# Patient Record
Sex: Female | Born: 2000 | Race: White | Hispanic: No | Marital: Single | Smoking: Current every day smoker
Health system: Southern US, Community
[De-identification: ages and names within clinical notes are randomized; demographics above are authoritative.]

## PROBLEM LIST (undated history)

## (undated) DIAGNOSIS — J45909 Unspecified asthma, uncomplicated: Secondary | ICD-10-CM

## (undated) DIAGNOSIS — H6690 Otitis media, unspecified, unspecified ear: Secondary | ICD-10-CM

## (undated) HISTORY — PX: OTHER SURGICAL HISTORY: SHX169

---
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
## 2021-10-27 ENCOUNTER — Telehealth: Payer: Medicaid Other | Admitting: Physician Assistant

## 2021-10-27 DIAGNOSIS — L0501 Pilonidal cyst with abscess: Secondary | ICD-10-CM | POA: Diagnosis not present

## 2021-10-27 MED ORDER — DOXYCYCLINE HYCLATE 100 MG PO TABS: 100.0000 mg | ORAL_TABLET | Freq: Two times a day (BID) | ORAL | 0 refills | Status: AC

## 2021-10-27 MED ORDER — MUPIROCIN 2 % EX OINT: 1.0000 | TOPICAL_OINTMENT | Freq: Two times a day (BID) | CUTANEOUS | 0 refills | Status: AC

## 2021-10-27 NOTE — Patient Instructions (Signed)
Abigail Mann, thank you for joining Mar Daring, PA-C for today's virtual visit.  While this provider is not your primary care provider (PCP), if your PCP is located in our provider database this encounter information will be shared with them immediately following your visit.  Consent: (Patient) Abigail Mann provided verbal consent for this virtual visit at the beginning of the encounter.  Current Medications:  Current Outpatient Medications:    doxycycline (VIBRA-TABS) 100 MG tablet, Take 1 tablet (100 mg total) by mouth 2 (two) times daily., Disp: 20 tablet, Rfl: 0   mupirocin ointment (BACTROBAN) 2 %, Apply 1 Application topically 2 (two) times daily., Disp: 22 g, Rfl: 0   acetaminophen (TYLENOL) 325 MG tablet, Take 2 tablets (650 mg total) by mouth every 4 (four) hours as needed (for pain scale < 4)., Disp: , Rfl:    benzocaine-Menthol (DERMOPLAST) 20-0.5 % AERO, Apply 1 application topically as needed for irritation (perineal discomfort)., Disp: , Rfl:    coconut oil OIL, Apply 1 application topically as needed., Disp: , Rfl: 0   diphenhydrAMINE (BENADRYL) 25 mg capsule, Take 1 capsule (25 mg total) by mouth every 6 (six) hours as needed for itching., Disp: 30 capsule, Rfl: 0   ibuprofen (ADVIL) 600 MG tablet, Take 1 tablet (600 mg total) by mouth every 6 (six) hours., Disp: 30 tablet, Rfl: 0   Prenatal Vit-Fe Fumarate-FA (PRENATAL MULTIVITAMIN) TABS tablet, Take 1 tablet by mouth daily at 12 noon., Disp: , Rfl:    senna-docusate (SENOKOT-S) 8.6-50 MG tablet, Take 2 tablets by mouth daily., Disp: 30 tablet, Rfl: 0   simethicone (MYLICON) 80 MG chewable tablet, Chew 1 tablet (80 mg total) by mouth as needed for flatulence., Disp: 30 tablet, Rfl: 0   witch hazel-glycerin (TUCKS) pad, Apply 1 application topically as needed for hemorrhoids (for pain)., Disp: 40 each, Rfl: 12   Medications ordered in this encounter:  Meds ordered this encounter  Medications   doxycycline  (VIBRA-TABS) 100 MG tablet    Sig: Take 1 tablet (100 mg total) by mouth 2 (two) times daily.    Dispense:  20 tablet    Refill:  0    Order Specific Question:   Supervising Provider    Answer:   Chase Picket [[id number]]   mupirocin ointment (BACTROBAN) 2 %    Sig: Apply 1 Application topically 2 (two) times daily.    Dispense:  22 g    Refill:  0    Order Specific Question:   Supervising Provider    Answer:   Chase Picket [id number]     *If you need refills on other medications prior to your next appointment, please contact your pharmacy*  Follow-Up: Call back or seek an in-person evaluation if the symptoms worsen or if the condition fails to improve as anticipated.  [place] [phone]  Other Instructions Pilonidal Cyst  A pilonidal cyst is a fluid-filled sac that forms under the skin near the tailbone, at the top of the crease of the buttocks (pilonidal area). Cysts that are small and not infected may not cause any problems. Cysts that become irritated or infected may grow and fill with pus. An infected cyst is called an abscess. A pilonidal abscess may cause pain and swelling. It may need to be drained or removed. What are the causes? The cause of this condition is not always known. In some cases, it may be caused by a hair that grows into your  skin (ingrown hair). What increases the risk? You are more likely to develop this condition if: You are female. You have lots of hair near the crease of the buttocks. You are overweight. You have a dimple near the crease of the buttocks. You wear tight clothing. You do not bathe or shower often. You sit for long periods of time. What are the signs or symptoms? Symptoms of this condition may include pain, swelling, redness, and warmth in the pilonidal area. You may also be able to feel a lump near your tailbone if your cyst is big. If your cyst becomes infected, symptoms may include: Pus or fluid  drainage. Fever. Pain, swelling, and redness getting worse. The lump getting bigger. How is this diagnosed? This condition may be diagnosed based on: Your symptoms and medical history. A physical exam. A blood test to check for infection. A test of a pus sample. How is this treated? You may not need any treatment if your cyst does not cause symptoms. If your cyst bothers you or is infected, you may need a procedure to drain or remove the cyst. Depending on the size, location, and severity of your cyst, your health care provider may: Make an incision in the cyst and drain it (incision anddrainage). Open and drain the cyst, and then stitch the wound so that it stays open while it heals (marsupialization). You will be given instructions about how to care for your open wound while it heals. Remove all or part of the cyst, and then close the wound (cyst removal). You may need to take antibiotics before your procedure. Follow these instructions at home: Medicines Take over-the-counter and prescription medicines only as told by your health care provider. If you were prescribed antibiotics, take them as told by your health care provider. Do not stop taking the antibiotic even if you start to feel better. General instructions Keep the area around the cyst clean and dry. If there is fluid or pus draining from your cyst: Cover the area with a clean bandage (dressing). Wash the area gently with soap and water. Pat the area dry with a clean towel. Do not rub the area because that may cause bleeding. Remove hair from the area around the cyst only if your health care provider tells you to. Do not wear tight pants or sit in one position for long periods at a time. Contact a health care provider if: You have new redness, swelling, or pain. You have a fever. You have severe pain. Summary A pilonidal cyst is a fluid-filled sac that forms under the skin near the tailbone, at the top of the crease of the  buttocks (pilonidal area). Cysts that become irritated or infected may grow and fill with pus. An infected cyst is called an abscess. The cause of this condition is not always known. In some cases, it may be caused by a hair that grows into your skin (ingrown hair). You may not need any treatment if your cyst does not cause symptoms. If your cyst bothers you or is infected, you may need a procedure to drain or remove the cyst. This information is not intended to replace advice given to you by your health care provider. Make sure you discuss any questions you have with your health care provider. Document Revised: 03/29/2021 Document Reviewed: 03/29/2021 Elsevier Patient Education  2023 [employer].    If you have been instructed to have an in-person evaluation today at a local Urgent Care facility, please use the  link below. It will take you to a list of all of our available [facility], including address, phone number and hours of operation. Please do not delay care.  [facility]  If you or a family member do not have a primary care provider, use the link below to schedule a visit and establish care. When you choose a [facility] primary care physician or advanced practice provider, you gain a long-term partner in health. Find a Primary Care Provider  Learn more about [facility]'s in-office and virtual care options: [facility]

## 2021-10-27 NOTE — Progress Notes (Signed)
Virtual Visit Consent   [name], you are scheduled for a virtual visit with a [facility] provider today. Just as with appointments in the office, your consent must be obtained to participate. Your consent will be active for this visit and any virtual visit you may have with one of our providers in the next 365 days. If you have a MyChart account, a copy of this consent can be sent to you electronically.  As this is a virtual visit, video technology does not allow for your provider to perform a traditional examination. This may limit your provider's ability to fully assess your condition. If your provider identifies any concerns that need to be evaluated in person or the need to arrange testing (such as labs, EKG, etc.), we will make arrangements to do so. Although advances in technology are sophisticated, we cannot ensure that it will always work on either your end or our end. If the connection with a video visit is poor, the visit may have to be switched to a telephone visit. With either a video or telephone visit, we are not always able to ensure that we have a secure connection.  By engaging in this virtual visit, you consent to the provision of healthcare and authorize for your insurance to be billed (if applicable) for the services provided during this visit. Depending on your insurance coverage, you may receive a charge related to this service.  I need to obtain your verbal consent now. Are you willing to proceed with your visit today? ALLEAN SCHWIEBERT has provided verbal consent on 10/27/2021 for a virtual visit (video or telephone). Mar Daring, PA-C  Date: 10/27/2021 7:59 AM  Virtual Visit via Video Note   I, Mar Daring, connected with  Abigail Mann  ([id number], 28-Jun-2000) on 10/27/21 at  8:00 AM EDT by a video-enabled telemedicine application and verified that I am speaking with the correct person using two identifiers.  Location: Patient: Virtual Visit  Location Patient: Home Provider: Virtual Visit Location Provider: Home Office   I discussed the limitations of evaluation and management by telemedicine and the availability of in person appointments. The patient expressed understanding and agreed to proceed.    History of Present Illness: Abigail Mann is a 21 y.o. who identifies as a female who was assigned female at birth, and is being seen today for boil. It is at the top of the buttocks. Started about 5 days ago. Has had similar in the past. Has been applying Mupirocin cream and antibiotic soaps. Now starting to cause significant pain with movements and having some mild nausea with it.    Problems:  Patient Active Problem List   Diagnosis Date Noted   Premature rupture of membranes 08/18/2020   Pregnancy 08/17/2020    Allergies:  Allergies  Allergen Reactions   Other Anaphylaxis    Sour candy (??) Had to receive a shot of Epinephrine   Amoxicillin Hives    Did it involve swelling of the face/tongue/throat, SOB, or low BP? No Did it involve sudden or severe rash/hives, skin peeling, or any reaction on the inside of your mouth or nose? Yes Did you need to seek medical attention at a hospital or doctor's office? Yes When did it last happen?     Pt was 21 yrs old  If all above answers are "NO", may proceed with cephalosporin use.     Mango Flavor    Penicillins Other (See Comments)    Did it  involve swelling of the face/tongue/throat, SOB, or low BP? Unknown Did it involve sudden or severe rash/hives, skin peeling, or any reaction on the inside of your mouth or nose? Unknown Did you need to seek medical attention at a hospital or doctor's office? Unknown When did it last happen?      3 yrs ago had a rx to amoxicillin If all above answers are "NO", may proceed with cephalosporin use.    Pineapple    Medications:  Current Outpatient Medications:    doxycycline (VIBRA-TABS) 100 MG tablet, Take 1 tablet (100 mg total) by mouth  2 (two) times daily., Disp: 20 tablet, Rfl: 0   mupirocin ointment (BACTROBAN) 2 %, Apply 1 Application topically 2 (two) times daily., Disp: 22 g, Rfl: 0   acetaminophen (TYLENOL) 325 MG tablet, Take 2 tablets (650 mg total) by mouth every 4 (four) hours as needed (for pain scale < 4)., Disp: , Rfl:    benzocaine-Menthol (DERMOPLAST) 20-0.5 % AERO, Apply 1 application topically as needed for irritation (perineal discomfort)., Disp: , Rfl:    coconut oil OIL, Apply 1 application topically as needed., Disp: , Rfl: 0   diphenhydrAMINE (BENADRYL) 25 mg capsule, Take 1 capsule (25 mg total) by mouth every 6 (six) hours as needed for itching., Disp: 30 capsule, Rfl: 0   ibuprofen (ADVIL) 600 MG tablet, Take 1 tablet (600 mg total) by mouth every 6 (six) hours., Disp: 30 tablet, Rfl: 0   Prenatal Vit-Fe Fumarate-FA (PRENATAL MULTIVITAMIN) TABS tablet, Take 1 tablet by mouth daily at 12 noon., Disp: , Rfl:    senna-docusate (SENOKOT-S) 8.6-50 MG tablet, Take 2 tablets by mouth daily., Disp: 30 tablet, Rfl: 0   simethicone (MYLICON) 80 MG chewable tablet, Chew 1 tablet (80 mg total) by mouth as needed for flatulence., Disp: 30 tablet, Rfl: 0   witch hazel-glycerin (TUCKS) pad, Apply 1 application topically as needed for hemorrhoids (for pain)., Disp: 40 each, Rfl: 12  Observations/Objective: Patient is well-developed, well-nourished in no acute distress.  Resting comfortably at home.  Head is normocephalic, atraumatic.  No labored breathing.  Speech is clear and coherent with logical content.  Patient is alert and oriented at baseline.    Assessment and Plan: 1. Pilonidal cyst with abscess - doxycycline (VIBRA-TABS) 100 MG tablet; Take 1 tablet (100 mg total) by mouth 2 (two) times daily.  Dispense: 20 tablet; Refill: 0 - mupirocin ointment (BACTROBAN) 2 %; Apply 1 Application topically 2 (two) times daily.  Dispense: 22 g; Refill: 0  - Doxycycline prescribed; confirmed patient not breastfeeding -  Mupirocin refilled - Warm compresses - Epsom salt soaks - Keep area clean and dry - Seek in person evaluation if not improving or worsens  Follow Up Instructions: I discussed the assessment and treatment plan with the patient. The patient was provided an opportunity to ask questions and all were answered. The patient agreed with the plan and demonstrated an understanding of the instructions.  A copy of instructions were sent to the patient via MyChart unless otherwise noted below.    The patient was advised to call back or seek an in-person evaluation if the symptoms worsen or if the condition fails to improve as anticipated.  Time:  I spent 8 minutes with the patient via telehealth technology discussing the above problems/concerns.    Mar Daring, PA-C

## 2021-12-05 IMAGING — US US OB COMP +14 WK
1 series · 13 of 28 positions shown · non-contrast
Comparison: none

CLINICAL DATA: Second trimester pregnancy, unknown gestational age

EXAM:
OBSTETRICAL ULTRASOUND >14 WKS

[Series 1: us ob comp + 14 wk · 13 of 86 slices shown]
[im 4/86]
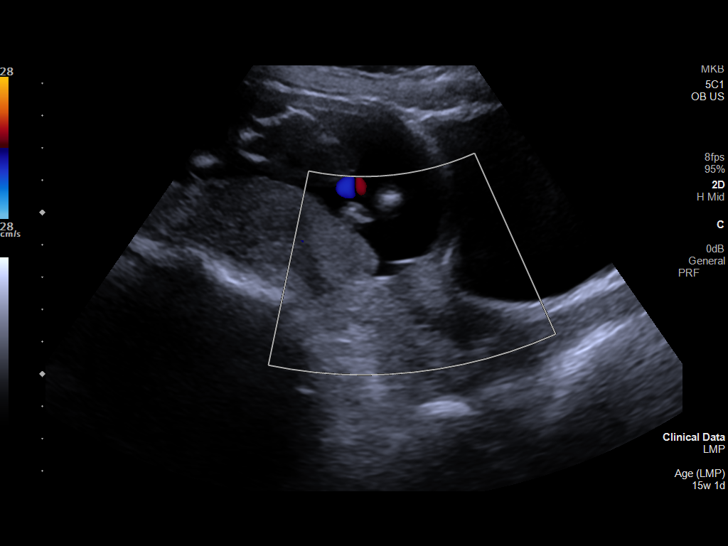
[im 10/86]
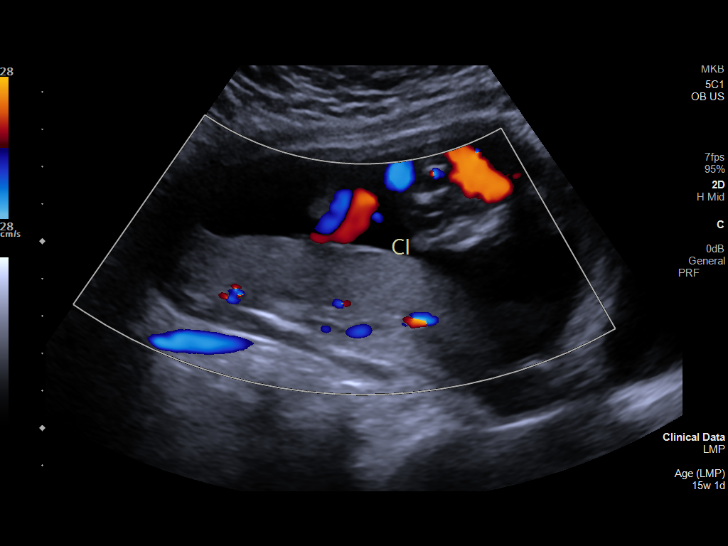
[im 16/86]
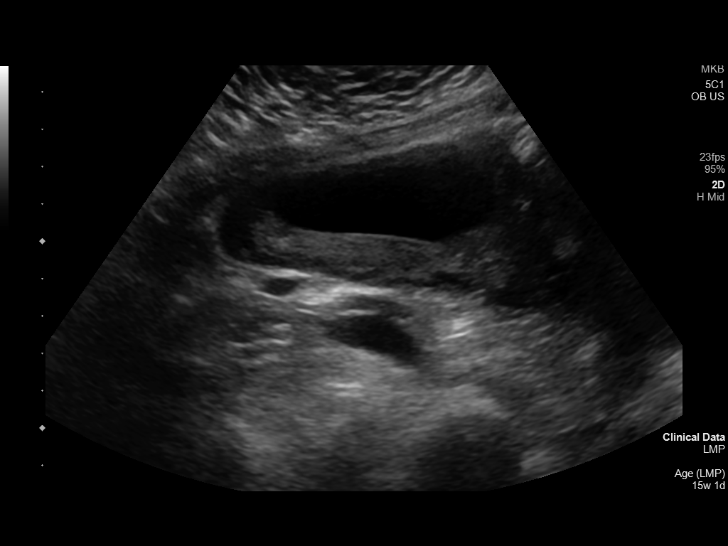
[im 23/86]
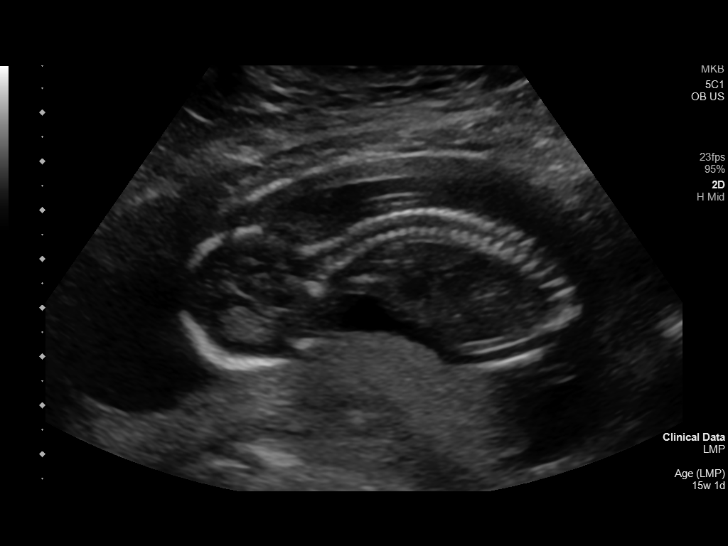
[im 29/86]
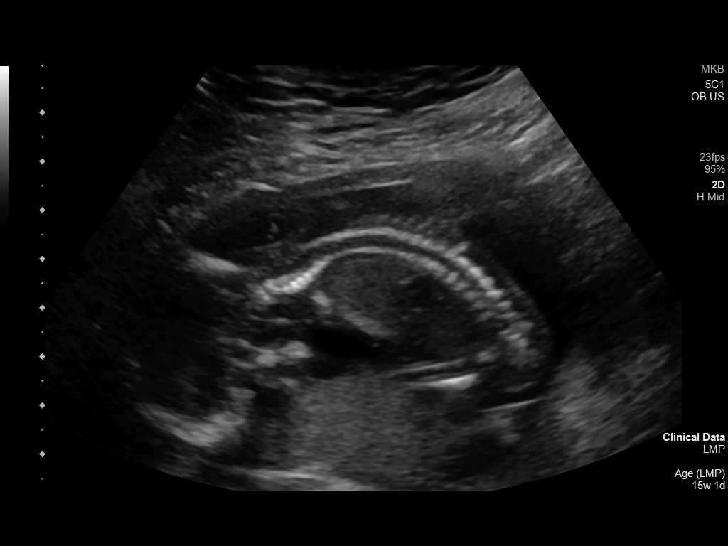
[im 35/86]
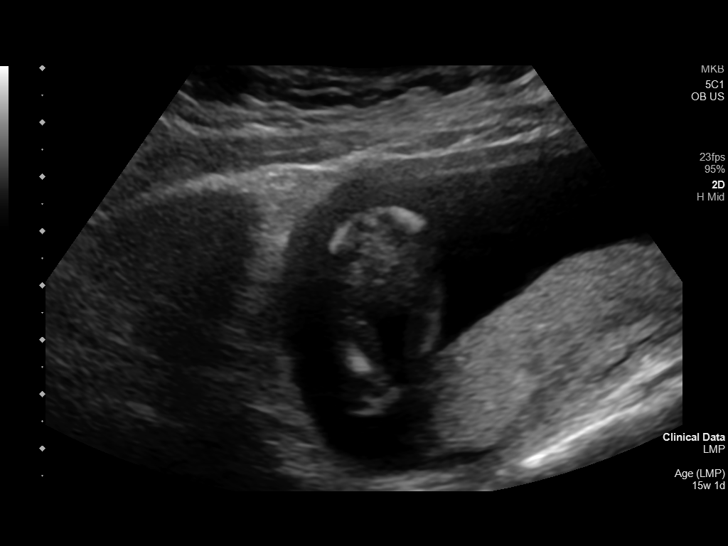
[im 45/86]
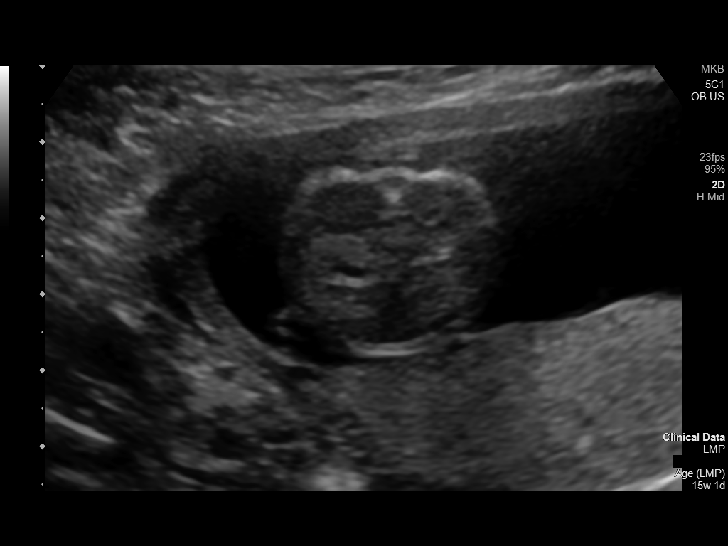
[im 51/86]
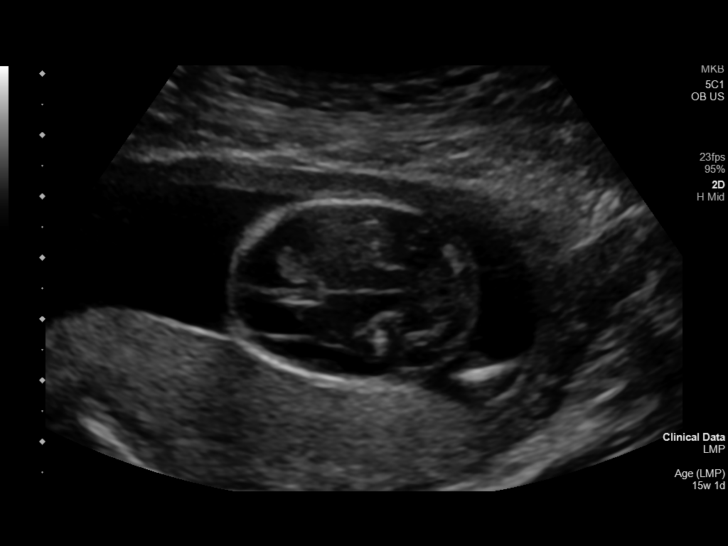
[im 57/86]
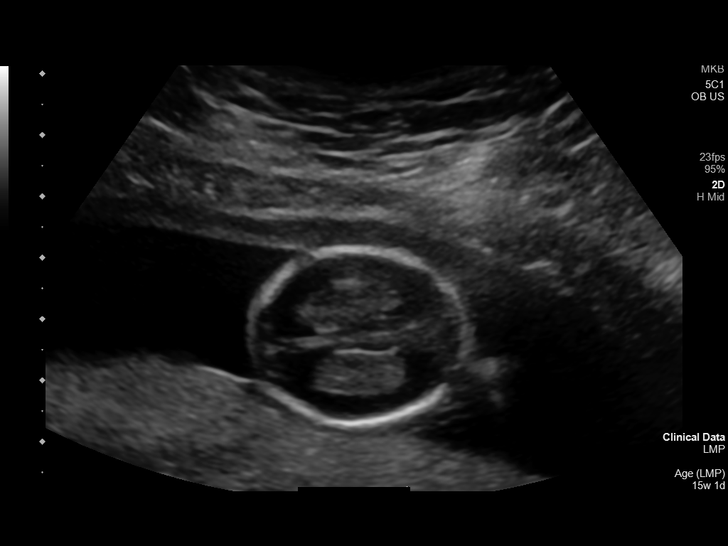
[im 63/86]
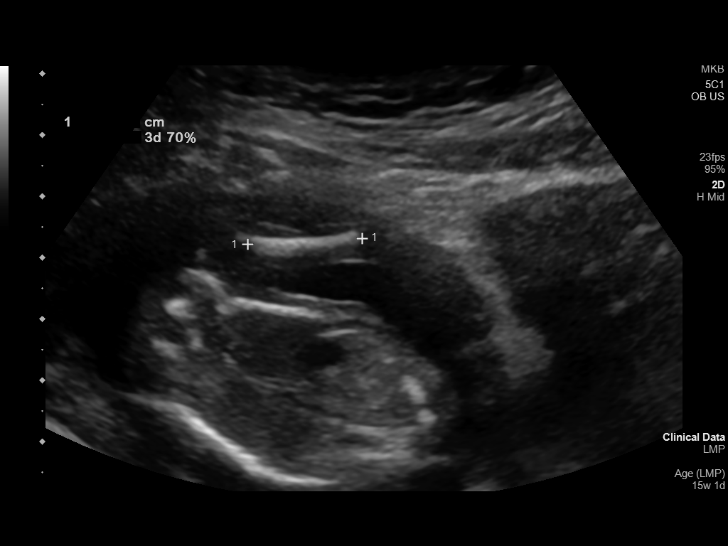
[im 70/86]
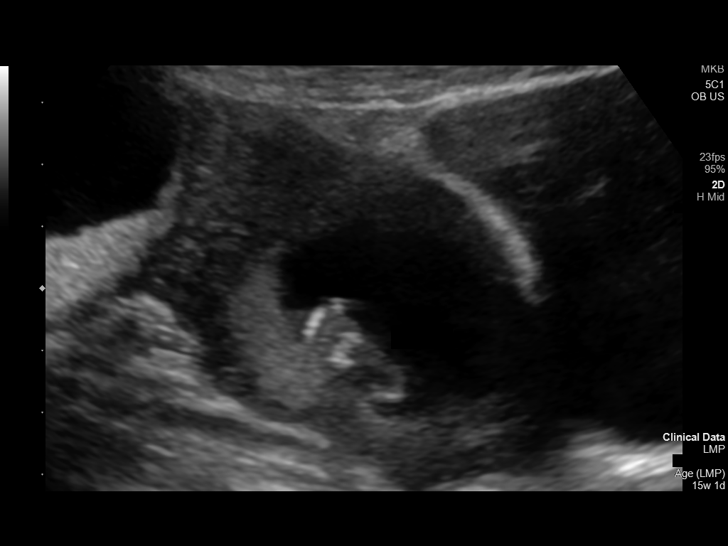
[im 76/86]
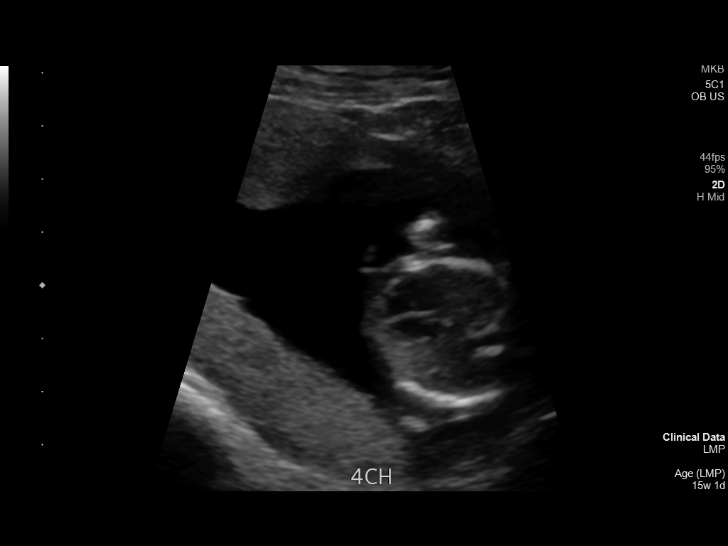
[im 82/86]
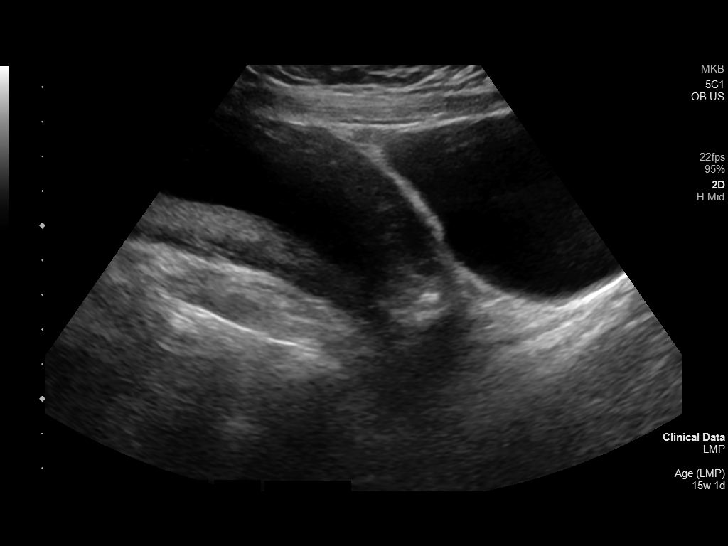

[13 of 28 positions shown; findings below may reference images not displayed]

FINDINGS: Number of Fetuses: 1

Heart Rate:  153 bpm

Movement: Yes

Presentation: Breech

Previa: No, the placenta is low lying with inferior margin 1.1 cm
from the internal cervical os.

Placental Location: Posterior

Amniotic Fluid (Subjective): Normal

Amniotic Fluid (Objective):

Vertical pocket = 2.7 cmcm

FETAL BIOMETRY

BPD: 3.0cm 50w 4d

HC:   11.7cm 50w 5d

AC:   9.7cm 15w 5d

FL:   1.9cm 15w 4d

Current Mean GA: 15w 4d US EDC: 08/18/2020

Assigned GA:  15w 1d Assigned EDC: 08/21/2020

FETAL ANATOMY

Lateral Ventricles: Appears normal

Thalami/CSP: Appears normal

Posterior Fossa:  Appears normal

Nuchal Region: Appears normal   NFT= 2.4 mm

Upper Lip: Not visualized

Spine: Appears normal

4 Chamber Heart on Left: Limited views appear normal

LVOT: Not visualized

RVOT: Not visualized

Stomach on Left: Appears normal

3 Vessel Cord: Not visualized

Cord Insertion site: Not visualized

Kidneys: Appears normal

Bladder: Appears normal

Extremities: Appears normal

Sex: Not Visualized

Technically difficult due to: Early gestational age

Maternal Findings:

Cervix:  Closed, 4.0 cm in length
IMPRESSION: 1. Single live intrauterine pregnancy as above, estimated
gestational age 15 weeks and 4 days.
2. Incomplete anatomic survey, with suboptimal visualization of the
upper lip, four-chamber heart, ventricular outflow tracks, umbilical
cord, cord insertion, and fetal sex. Follow-up obstetric ultrasound
at 20 weeks gestation is recommended to complete the anatomic
survey.
3. Low lying placenta without frank placenta previa. This could be
followed at the time of subsequent obstetric ultrasound.

## 2023-10-22 ENCOUNTER — Other Ambulatory Visit: Payer: Self-pay | Admitting: Family Medicine

## 2023-10-22 DIAGNOSIS — N6002 Solitary cyst of left breast: Secondary | ICD-10-CM

## 2023-10-22 DIAGNOSIS — N6325 Unspecified lump in the left breast, overlapping quadrants: Secondary | ICD-10-CM

## 2023-11-12 ENCOUNTER — Ambulatory Visit
Admission: RE | Admit: 2023-11-12 | Discharge: 2023-11-12 | Disposition: A | Discharge status: Home / Self Care | Source: Ambulatory Visit | Attending: Family Medicine | Admitting: Family Medicine

## 2023-11-12 DIAGNOSIS — N6325 Unspecified lump in the left breast, overlapping quadrants: Secondary | ICD-10-CM | POA: Insufficient documentation

## 2023-11-12 DIAGNOSIS — N6002 Solitary cyst of left breast: Secondary | ICD-10-CM | POA: Diagnosis present

## 2023-12-17 ENCOUNTER — Inpatient Hospital Stay: Attending: Licensed Clinical Social Worker | Admitting: Licensed Clinical Social Worker

## 2023-12-17 ENCOUNTER — Inpatient Hospital Stay

## 2023-12-17 NOTE — Progress Notes (Deleted)
 REFERRING PROVIDER: [facility], [facility] [address] [place],  [place] [address]  PRIMARY PROVIDER:  [facility], [facility]  PRIMARY REASON FOR VISIT:  1. Family history of breast cancer      HISTORY OF PRESENT ILLNESS:   Ms. Hoston, a 23 y.o. female, was seen for a [place] cancer genetics consultation at the request of Dr. Lorel due to a family history of breast cancer.  Ms. Scism presents to clinic today to discuss the possibility of a hereditary predisposition to cancer, genetic testing, and to further clarify her future cancer risks, as well as potential cancer risks for family members.   CANCER HISTORY:  Ms. Gawthrop is a 23 y.o. female with no personal history of cancer.    RELEVANT MEDICAL HISTORY:  Menarche was at age ***.  First live birth at age ***.  Ovaries intact: {Yes/No-Ex:120004}.  Hysterectomy: {Yes/No-Ex:120004}.  Menopausal status: {Menopause:31378}.  HRT use: {Numbers 1-12 multi-select:20307} years. Colonoscopy: {Yes/No-Ex:120004}; {normal/abnormal/not examined:14677}. Mammogram within the last year: {Yes/No-Ex:120004}. Number of breast biopsies: {Numbers 1-12 multi-select:20307}.  Past Medical History:  Diagnosis Date   Asthma    Otitis     Past Surgical History:  Procedure Laterality Date   tubes in ears      FAMILY HISTORY:  We obtained a detailed, 4-generation family history.  Significant diagnoses are listed below: Family History  Problem Relation Age of Onset   Hypertension Maternal Grandmother     Ms. Gernert is unaware of previous family history of genetic testing for hereditary cancer risks. There is no reported Ashkenazi Jewish ancestry. There is no known consanguinity.  GENETIC COUNSELING ASSESSMENT: Ms. Roylance is a 23 y.o. female with a family history of breast cancer*** which is somewhat suggestive of a hereditary cancer syndrome and predisposition to cancer. We, therefore, discussed  and recommended the following at today's visit.   DISCUSSION: We discussed that, in general, most cancer is not inherited in families, but instead is sporadic or familial. Sporadic cancers occur by chance and typically happen at older ages (>50 years). We discussed that approximately 10% of breast cancer is hereditary. Most cases of hereditary breast cancer are associated with BRCA1/BRCA2 genes, although there are other genes associated with hereditary cancer as well. Cancers and risks are gene specific. We discussed that testing is beneficial for several reasons including knowing about cancer risks, identifying potential screening and risk-reduction options that may be appropriate, and to understand if other family members could be at risk for cancer and allow them to undergo genetic testing.   We reviewed the characteristics, features and inheritance patterns of hereditary cancer syndromes. We also discussed genetic testing, including the appropriate family members to test, the process of testing, insurance coverage and turn-around-time for results. We discussed the implications of a negative, positive and/or variant of uncertain significant result. We recommended Ms. Sterne pursue genetic testing for the [facility] Common Hereditary Cancers+RNA gene panel.   The Common Hereditary Cancers Panel + RNA offered by [facility] includes sequencing and/or deletion duplication testing of the following 48 genes: APC*, ATM*, AXIN2, BAP1, BARD1, BMPR1A, BRCA1, BRCA2, BRIP1, CDH1, CDK4, CDKN2A (p14ARF), CDKN2A (p16INK4a), CHEK2, CTNNA1, DICER1*, EPCAM*, FH*, GREM1*, HOXB13, KIT, MBD4, MEN1*, MLH1*, MSH2*, MSH3*, MSH6*, MUTYH, NF1*, NTHL1, PALB2, PDGFRA, PMS2*, POLD1*, POLE, PTEN*, RAD51C, RAD51D, SDHA*, SDHB, SDHC*, SDHD, SMAD4, SMARCA4, STK11, TP53, TSC1*, TSC2, VHL.   Based on Ms. Catena's family history of cancer, she meets medical criteria for genetic testing. Though Ms. Garrott is not personally  affected, there are  no affected family members that are willing/able to undergo hereditary cancer testing.  Therefore, Ms. Feasteris the most informative family member available. Despite that she meets criteria, she may still have an out of pocket cost.   We discussed that some people do not want to undergo genetic testing due to fear of genetic discrimination.  A federal law called the Genetic Information Non-Discrimination Act (GINA) of 2008 helps protect individuals against genetic discrimination based on their genetic test results.  It impacts both health insurance and employment.  For health insurance, it protects against increased premiums, being kicked off insurance or being forced to take a test in order to be insured.  For employment it protects against hiring, firing and promoting decisions based on genetic test results.  Health status due to a cancer diagnosis is not protected under GINA.  This law does not protect life insurance, disability insurance, or other types of insurance.   PLAN: After considering the risks, benefits, and limitations, Ms. Rappa provided informed consent to pursue genetic testing and the blood sample was sent to [facility] for analysis of the Common Hereditary Cancers+RNA Panel. Results should be available within approximately 2-3 weeks' time, at which point they will be disclosed by telephone to Ms. Rucci, as will any additional recommendations warranted by these results. Ms. Casten will receive a summary of her genetic counseling visit and a copy of her results once available. This information will also be available in Epic.   *** Despite our recommendation, Ms. Slabach did not wish to pursue genetic testing at today's visit. We understand this decision and remain available to coordinate genetic testing at any time in the future. We, therefore, recommend Ms. Thorp continue to follow the cancer screening guidelines given by her primary healthcare provider.  Ms. Linebaugh  questions were answered to her satisfaction today. Our contact information was provided should additional questions or concerns arise. Thank you for the referral and allowing us  to share in the care of your patient.   Dena Cary, MS, [facility] Genetic Counselor [email or web address] Phone: [phone]  I personally spent a total of *** minutes in the care of the patient today including counseling and educating, placing orders, and documenting clinical information in the EHR.  _______________________________________________________________________ For Office Staff:  Number of people involved in session: *** Was an Intern/ student involved with case: no
